# Patient Record
Sex: Male | Born: 2005 | Race: Black or African American | Hispanic: No | Marital: Single | Smoking: Never smoker
Health system: Southern US, Community
[De-identification: ages and names within clinical notes are randomized; demographics above are authoritative.]

---
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
## 2024-01-30 DIAGNOSIS — Z419 Encounter for procedure for purposes other than remedying health state, unspecified: Secondary | ICD-10-CM | POA: Diagnosis not present

## 2024-03-01 DIAGNOSIS — Z419 Encounter for procedure for purposes other than remedying health state, unspecified: Secondary | ICD-10-CM | POA: Diagnosis not present

## 2024-03-31 DIAGNOSIS — Z419 Encounter for procedure for purposes other than remedying health state, unspecified: Secondary | ICD-10-CM | POA: Diagnosis not present

## 2024-05-01 DIAGNOSIS — Z419 Encounter for procedure for purposes other than remedying health state, unspecified: Secondary | ICD-10-CM | POA: Diagnosis not present

## 2024-06-01 DIAGNOSIS — Z419 Encounter for procedure for purposes other than remedying health state, unspecified: Secondary | ICD-10-CM | POA: Diagnosis not present

## 2024-07-01 DIAGNOSIS — Z419 Encounter for procedure for purposes other than remedying health state, unspecified: Secondary | ICD-10-CM | POA: Diagnosis not present
